# Patient Record
Sex: Female | Born: 1970 | Hispanic: Yes | Marital: Married | Smoking: Never smoker
Health system: Southern US, Community
[De-identification: ages and names within clinical notes are randomized; demographics above are authoritative.]

## PROBLEM LIST (undated history)

## (undated) ENCOUNTER — Ambulatory Visit: Discharge status: Absent without leave | Payer: 59

## (undated) DIAGNOSIS — N200 Calculus of kidney: Secondary | ICD-10-CM

## (undated) HISTORY — PX: COLONOSCOPY: SHX174

---

## 2017-11-16 DIAGNOSIS — Z0000 Encounter for general adult medical examination without abnormal findings: Secondary | ICD-10-CM | POA: Diagnosis not present

## 2017-11-16 DIAGNOSIS — Z13220 Encounter for screening for lipoid disorders: Secondary | ICD-10-CM | POA: Diagnosis not present

## 2018-05-12 DIAGNOSIS — M79601 Pain in right arm: Secondary | ICD-10-CM | POA: Diagnosis not present

## 2018-07-04 DIAGNOSIS — Z87442 Personal history of urinary calculi: Secondary | ICD-10-CM | POA: Diagnosis not present

## 2018-11-18 DIAGNOSIS — Z13220 Encounter for screening for lipoid disorders: Secondary | ICD-10-CM | POA: Diagnosis not present

## 2018-11-18 DIAGNOSIS — Z124 Encounter for screening for malignant neoplasm of cervix: Secondary | ICD-10-CM | POA: Diagnosis not present

## 2018-11-18 DIAGNOSIS — Z0000 Encounter for general adult medical examination without abnormal findings: Secondary | ICD-10-CM | POA: Diagnosis not present

## 2018-11-18 DIAGNOSIS — Z23 Encounter for immunization: Secondary | ICD-10-CM | POA: Diagnosis not present

## 2018-11-29 ENCOUNTER — Other Ambulatory Visit: Payer: Self-pay | Admitting: Physician Assistant

## 2018-11-29 DIAGNOSIS — Z1231 Encounter for screening mammogram for malignant neoplasm of breast: Secondary | ICD-10-CM

## 2018-12-28 ENCOUNTER — Inpatient Hospital Stay: Admission: RE | Admit: 2018-12-28 | Payer: Self-pay | Source: Ambulatory Visit

## 2019-01-30 ENCOUNTER — Ambulatory Visit: Payer: Self-pay

## 2019-03-20 ENCOUNTER — Ambulatory Visit
Admission: RE | Admit: 2019-03-20 | Discharge: 2019-03-20 | Disposition: A | Discharge status: Home / Self Care | Payer: 59 | Source: Ambulatory Visit | Attending: Physician Assistant | Admitting: Physician Assistant

## 2019-03-20 DIAGNOSIS — Z1231 Encounter for screening mammogram for malignant neoplasm of breast: Secondary | ICD-10-CM

## 2021-03-27 IMAGING — MG DIGITAL SCREENING BILATERAL MAMMOGRAM WITH TOMO AND CAD
8 series · 9 of 24 positions shown · non-contrast
Comparison: Previous exam(s).

CLINICAL DATA: Screening.

EXAM:
DIGITAL SCREENING BILATERAL MAMMOGRAM WITH TOMO AND CAD

[L MLO synth-2D]
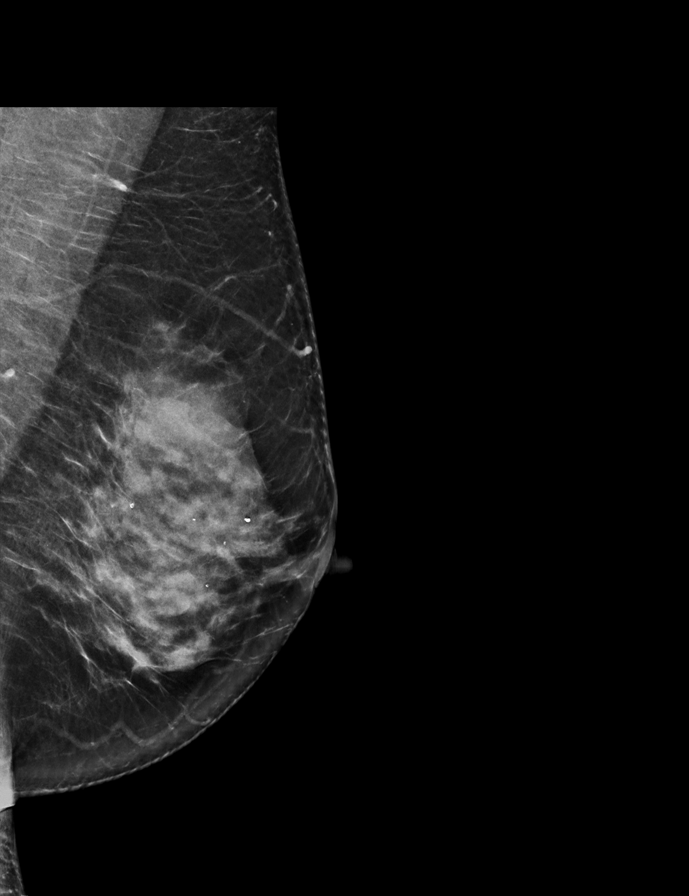

[R MLO synth-2D]
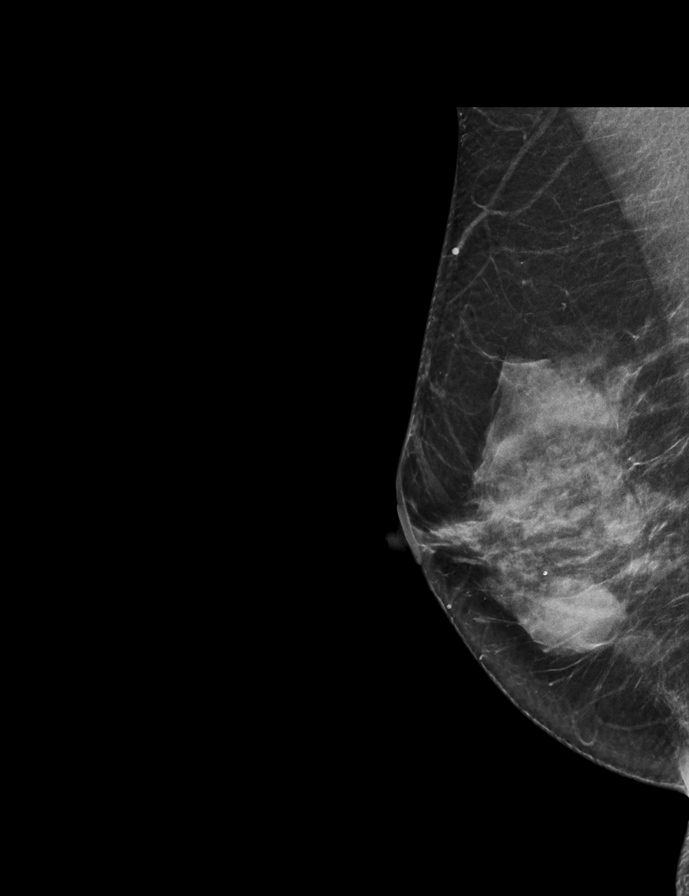

[R CC synth-2D]
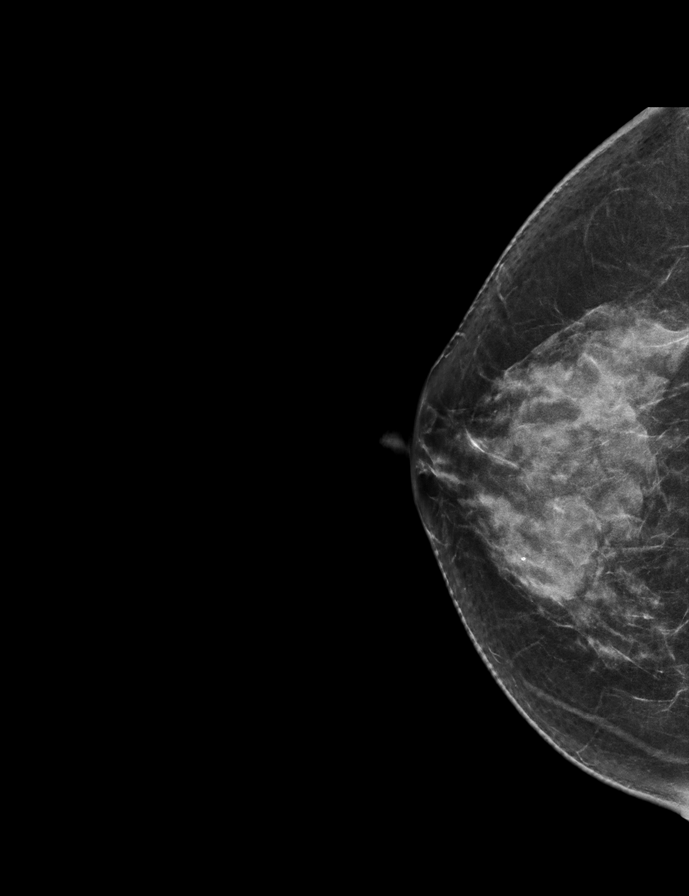

[L CC synth-2D]
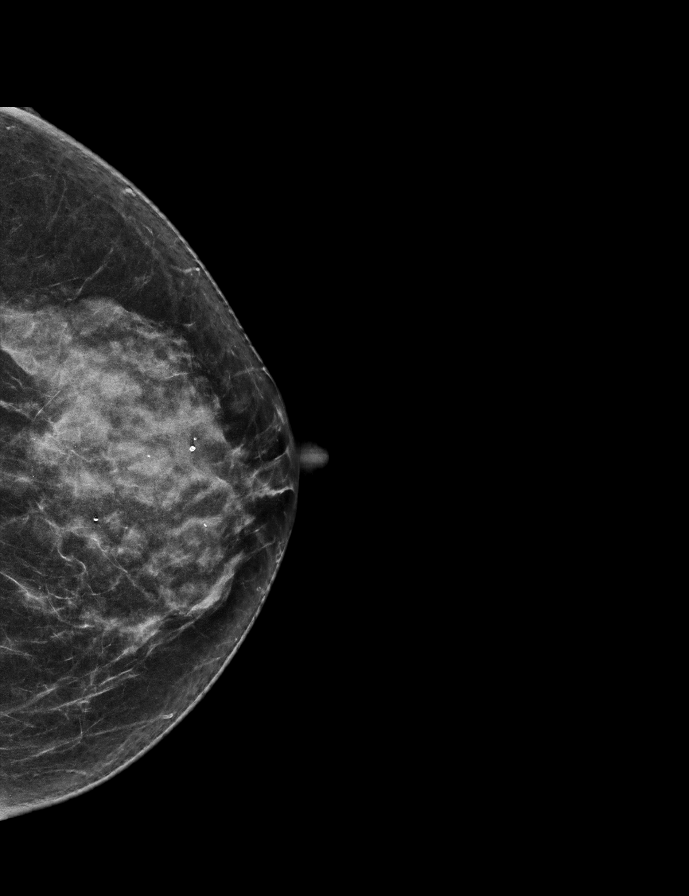

[L CC tomo · 2 of 61 frames shown]
[frame 20/61]
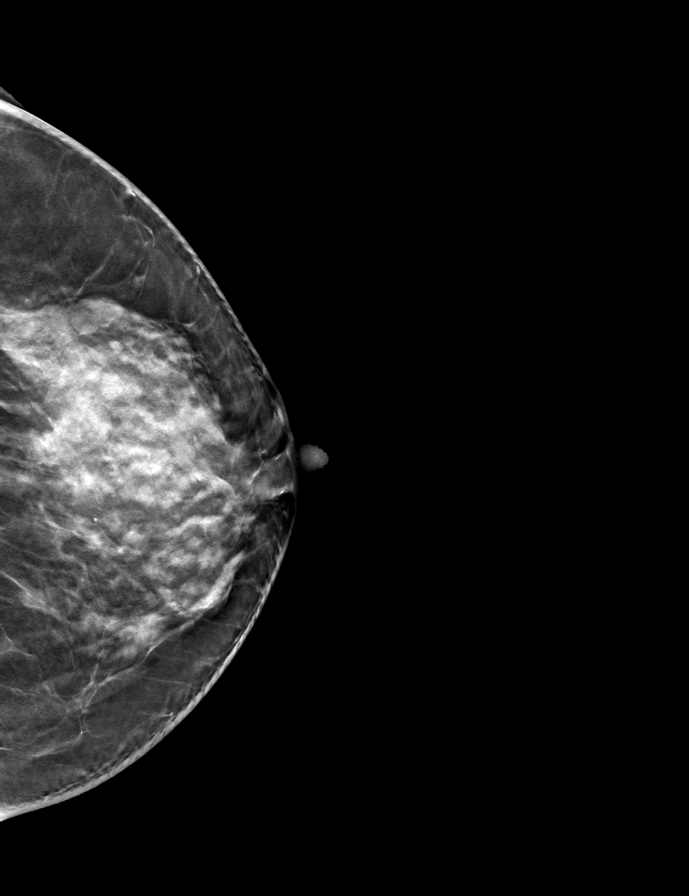
[frame 31/61]
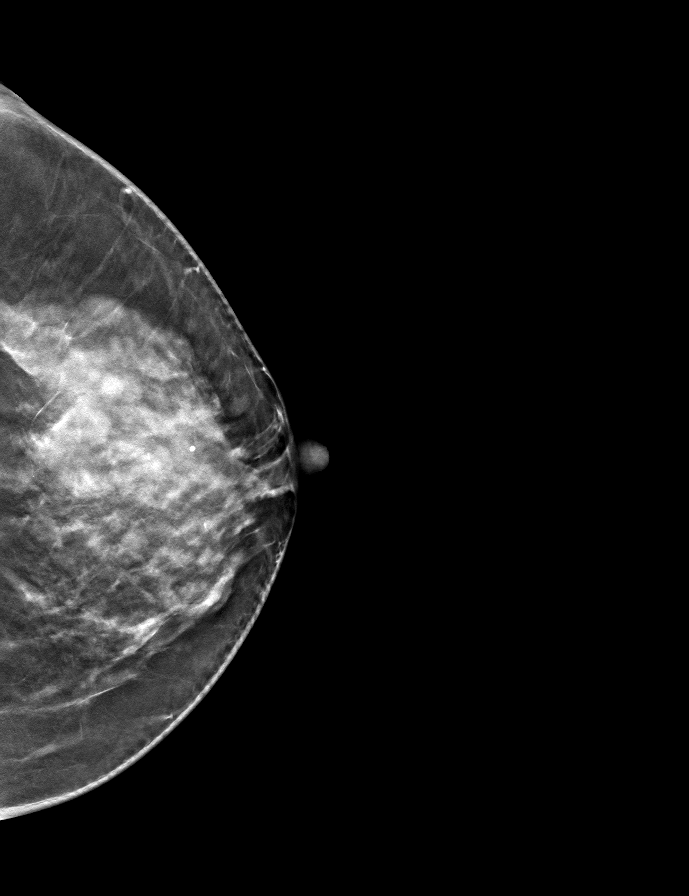

[R CC tomo · tomo slice 33/65.0]
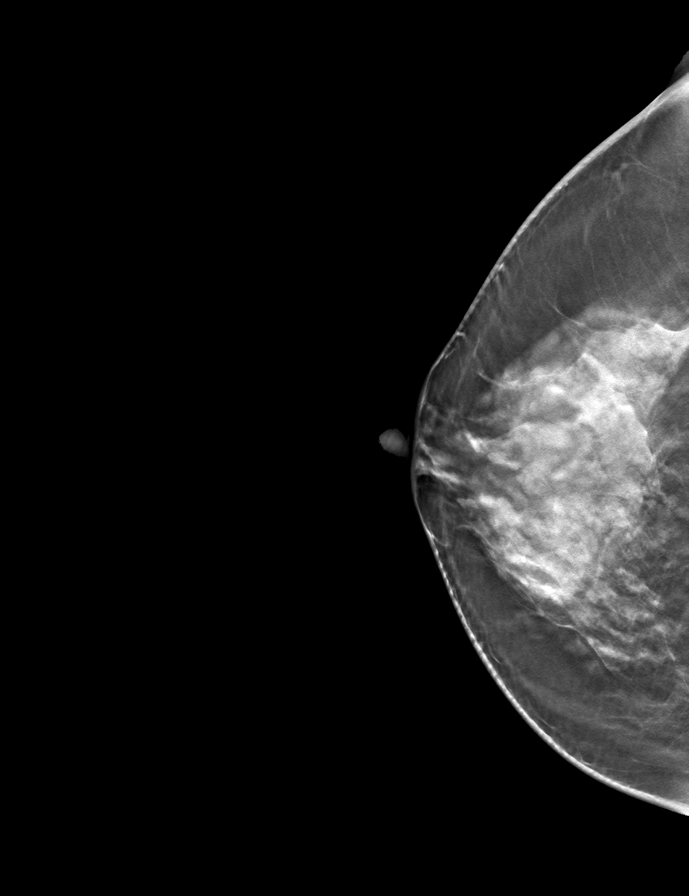

[R MLO tomo · tomo slice 33/66.0]
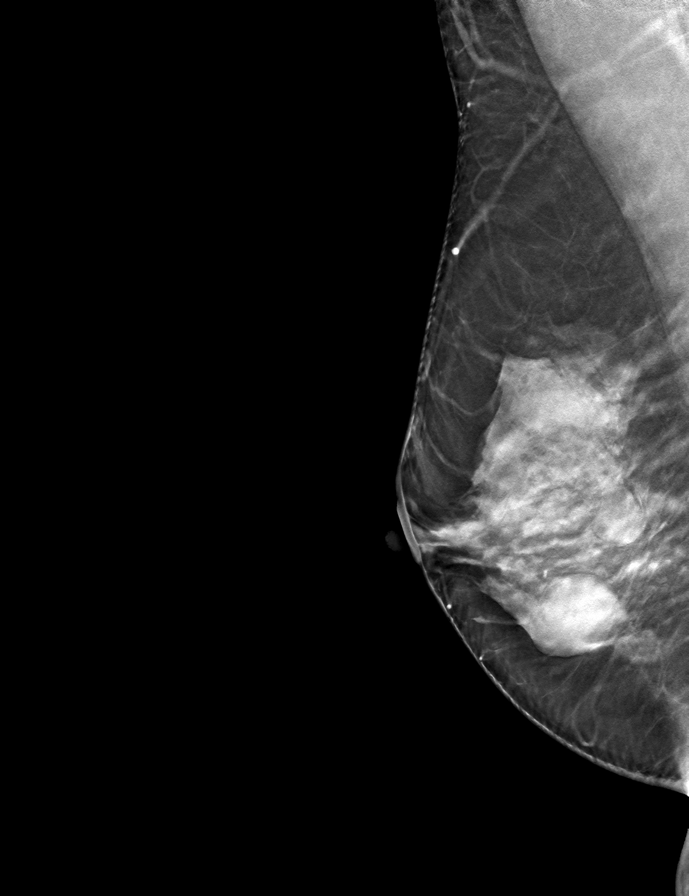

[L MLO tomo · tomo slice 36/71.0]
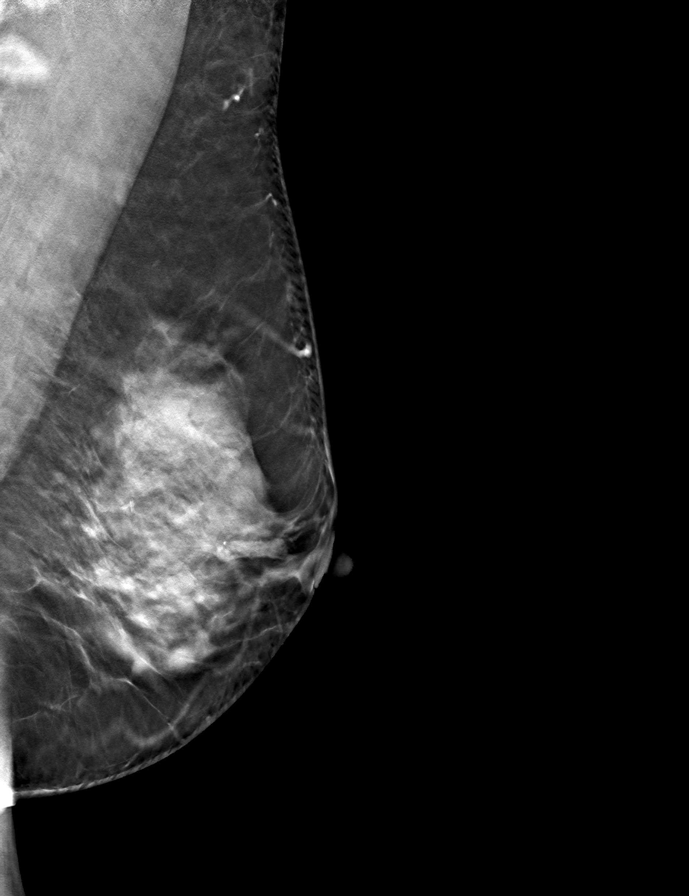

[9 of 24 positions shown; findings below may reference images not displayed]

ACR Breast Density Category c: The breast tissue is heterogeneously
dense, which may obscure small masses.
FINDINGS: There are no findings suspicious for malignancy.

Waxing and waning circumscribed oval masses within both breasts are
considered benign.

Images were processed with CAD.
IMPRESSION: No mammographic evidence of malignancy. A result letter of this
screening mammogram will be mailed directly to the patient.

RECOMMENDATION:
Screening mammogram in one year. (Code:FX-I-SHX)

BI-RADS CATEGORY  2: Benign.

## 2021-10-09 ENCOUNTER — Emergency Department (HOSPITAL_BASED_OUTPATIENT_CLINIC_OR_DEPARTMENT_OTHER): Payer: 59

## 2021-10-09 ENCOUNTER — Emergency Department (HOSPITAL_BASED_OUTPATIENT_CLINIC_OR_DEPARTMENT_OTHER)
Admission: EM | Admit: 2021-10-09 | Discharge: 2021-10-09 | Disposition: A | Discharge status: Home / Self Care | Payer: 59 | Attending: Emergency Medicine | Admitting: Emergency Medicine

## 2021-10-09 ENCOUNTER — Other Ambulatory Visit: Payer: Self-pay

## 2021-10-09 ENCOUNTER — Encounter (HOSPITAL_BASED_OUTPATIENT_CLINIC_OR_DEPARTMENT_OTHER): Payer: Self-pay | Admitting: Emergency Medicine

## 2021-10-09 DIAGNOSIS — N201 Calculus of ureter: Secondary | ICD-10-CM | POA: Diagnosis not present

## 2021-10-09 DIAGNOSIS — R1031 Right lower quadrant pain: Secondary | ICD-10-CM | POA: Diagnosis present

## 2021-10-09 HISTORY — DX: Calculus of kidney: N20.0

## 2021-10-09 LAB — URINALYSIS, ROUTINE W REFLEX MICROSCOPIC
Bilirubin Urine: NEGATIVE
Glucose, UA: NEGATIVE mg/dL
Ketones, ur: NEGATIVE mg/dL
Leukocytes,Ua: NEGATIVE
Nitrite: NEGATIVE
Protein, ur: NEGATIVE mg/dL
Specific Gravity, Urine: 1.02 (ref 1.005–1.030)
pH: 7.5 (ref 5.0–8.0)

## 2021-10-09 LAB — PREGNANCY, URINE: Preg Test, Ur: NEGATIVE

## 2021-10-09 LAB — URINALYSIS, MICROSCOPIC (REFLEX)

## 2021-10-09 MED ORDER — HYDROMORPHONE HCL 1 MG/ML IJ SOLN
1.0000 mg | Freq: Once | INTRAMUSCULAR | Status: AC
  Administered 2021-10-09: 1 mg via INTRAVENOUS
  Filled 2021-10-09: qty 1

## 2021-10-09 MED ORDER — ONDANSETRON HCL 4 MG/2ML IJ SOLN
4.0000 mg | Freq: Once | INTRAMUSCULAR | Status: AC
  Administered 2021-10-09: 4 mg via INTRAVENOUS
  Filled 2021-10-09: qty 2

## 2021-10-09 MED ORDER — TAMSULOSIN HCL 0.4 MG PO CAPS: ORAL_CAPSULE | ORAL | 0 refills | Status: AC

## 2021-10-09 MED ORDER — HYDROMORPHONE HCL 2 MG PO TABS: 2.0000 mg | ORAL_TABLET | ORAL | 0 refills | Status: AC | PRN

## 2021-10-09 MED ORDER — TAMSULOSIN HCL 0.4 MG PO CAPS
0.4000 mg | ORAL_CAPSULE | Freq: Once | ORAL | Status: AC
  Administered 2021-10-09: 0.4 mg via ORAL
  Filled 2021-10-09: qty 1

## 2021-10-09 MED ORDER — ONDANSETRON 8 MG PO TBDP: 8.0000 mg | ORAL_TABLET | Freq: Three times a day (TID) | ORAL | 1 refills | Status: AC | PRN

## 2021-10-09 NOTE — ED Provider Notes (Signed)
[place] [facility] Provider Note: Georgena Spurling, MD, FACEP  CSN: [id number] MRN: [id number] ARRIVAL: 10/09/21 at [place]: [place]  Abdominal Pain   HISTORY OF PRESENT ILLNESS  10/09/21 3:18 AM Jocelyn Kelley is a 51 y.o. female with a history of kidney stones.  She is here with right lower quadrant abdominal pain that began about an hour ago.  She rates the pain as a 9 out of 10 and likens it to previous kidney stones.  It is not worse with movement or palpation.  She has had some nausea but no vomiting.  She has not noticed hematuria.  She had a colonoscopy yesterday afternoon which she states went well.   Past Medical History:  Diagnosis Date   Kidney stones     Past Surgical History:  Procedure Laterality Date   COLONOSCOPY      History reviewed. No pertinent family history.  Social History   Tobacco Use   Smoking status: Never   Smokeless tobacco: Never  Substance Use Topics   Alcohol use: Never   Drug use: Never    Prior to Admission medications   Medication Sig Start Date End Date Taking? Authorizing Provider  HYDROmorphone (DILAUDID) 2 MG tablet Take 1 tablet (2 mg total) by mouth every 4 (four) hours as needed for severe pain. 10/09/21  Yes Oron Westrup, MD  ondansetron (ZOFRAN-ODT) 8 MG disintegrating tablet Take 1 tablet (8 mg total) by mouth every 8 (eight) hours as needed for nausea or vomiting. 10/09/21  Yes Delma Villalva, MD  tamsulosin (FLOMAX) 0.4 MG CAPS capsule Take 1 capsule daily until stone passes. 10/09/21  Yes Catalaya Garr, MD    Allergies Aspirin and Ibuprofen   REVIEW OF SYSTEMS  Negative except as noted here or in the History of Present Illness.   PHYSICAL EXAMINATION  Initial Vital Signs Blood pressure (!) 158/77, pulse 65, temperature 98.3 F (36.8 C), temperature source Oral, resp. rate (!) 22, height 5\' 2"  (1.575 m), weight 67.1 kg, SpO2 100 %.  Examination General: Well-developed, well-nourished  female in no acute distress; appearance consistent with age of record HENT: normocephalic; atraumatic Eyes: Normal appearance Neck: supple Heart: regular rate and rhythm Lungs: clear to auscultation bilaterally Abdomen: soft; nondistended; nontender; bowel sounds present GU: No CVA tenderness Extremities: No deformity; full range of motion; pulses normal Neurologic: Awake, alert and oriented; motor function intact in all extremities and symmetric; no facial droop Skin: Warm and dry Psychiatric: Appears anxious   RESULTS  Summary of this visit's results, reviewed and interpreted by myself:   EKG Interpretation  Date/Time:    Ventricular Rate:    PR Interval:    QRS Duration:   QT Interval:    QTC Calculation:   R Axis:     Text Interpretation:         Laboratory Studies: Results for orders placed or performed during the hospital encounter of 10/09/21 (from the past 24 hour(s))  Urinalysis, Routine w reflex microscopic Urine, Clean Catch     Status: Abnormal   Collection Time: 10/09/21  3:26 AM  Result Value Ref Range   Color, Urine YELLOW YELLOW   APPearance CLOUDY (A) CLEAR   Specific Gravity, Urine 1.020 1.005 - 1.030   pH 7.5 5.0 - 8.0   Glucose, UA NEGATIVE NEGATIVE mg/dL   Hgb urine dipstick MODERATE (A) NEGATIVE   Bilirubin Urine NEGATIVE NEGATIVE   Ketones, ur NEGATIVE NEGATIVE mg/dL   Protein, ur NEGATIVE NEGATIVE mg/dL  Nitrite NEGATIVE NEGATIVE   Leukocytes,Ua NEGATIVE NEGATIVE  Pregnancy, urine     Status: None   Collection Time: 10/09/21  3:26 AM  Result Value Ref Range   Preg Test, Ur NEGATIVE NEGATIVE  Urinalysis, Microscopic (reflex)     Status: Abnormal   Collection Time: 10/09/21  3:26 AM  Result Value Ref Range   RBC / HPF 21-50 0 - 5 RBC/hpf   WBC, UA 0-5 0 - 5 WBC/hpf   Bacteria, UA MANY (A) NONE SEEN   Squamous Epithelial / LPF 0-5 0 - 5   Amorphous Crystal PRESENT    Imaging Studies: CT Renal Stone Study  Result Date:  10/09/2021 CLINICAL DATA:  Flank pain.  Concern for kidney stone. EXAM: CT ABDOMEN AND PELVIS WITHOUT CONTRAST TECHNIQUE: Multidetector CT imaging of the abdomen and pelvis was performed following the standard protocol without IV contrast. RADIATION DOSE REDUCTION: This exam was performed according to the departmental dose-optimization program which includes automated exposure control, adjustment of the mA and/or kV according to patient size and/or use of iterative reconstruction technique. COMPARISON:  None. FINDINGS: Evaluation of this exam is limited in the absence of intravenous contrast. Lower chest: The visualized lung bases clear. No intra-abdominal free air or free fluid. Hepatobiliary: Fatty liver. No intrahepatic biliary ductal dilatation. The gallbladder is unremarkable. Pancreas: Unremarkable. No pancreatic ductal dilatation or surrounding inflammatory changes. Spleen: Normal in size without focal abnormality. Adrenals/Urinary Tract: The adrenal glands are unremarkable. There is a 5 mm stone at the right ureterovesical junction with mild right hydronephrosis. Several additional nonobstructing right renal calculi measure up to 4 mm in the inferior pole of the right kidney. The left kidney is unremarkable. The left ureter and urinary bladder appear unremarkable Stomach/Bowel: There is no bowel obstruction or active inflammation. The appendix is normal. Vascular/Lymphatic: The abdominal aorta and IVC are grossly unremarkable on this noncontrast CT. No portal venous gas. There is no adenopathy. Reproductive: The uterus is anteverted and grossly unremarkable. No adnexal masses. Other: None Musculoskeletal: No acute or significant osseous findings. IMPRESSION: 1. A 5 mm stone at the right ureterovesical junction with mild right hydronephrosis. 2. Several additional nonobstructing right renal calculi. 3. Fatty liver. 4. No bowel obstruction. Normal appendix. Electronically Signed   By: Anner Crete M.D.    On: 10/09/2021 03:47    ED COURSE and MDM  Nursing notes, initial and subsequent vitals signs, including pulse oximetry, reviewed and interpreted by myself.  Vitals:   10/09/21 0312 10/09/21 0316  BP:  (!) 158/77  Pulse:  65  Resp:  (!) 22  Temp:  98.3 F (36.8 C)  TempSrc:  Oral  SpO2:  100%  Weight: 67.1 kg   Height: 5\' 2"  (1.575 m)    Medications  tamsulosin (FLOMAX) capsule 0.4 mg (has no administration in time range)  ondansetron (ZOFRAN) injection 4 mg (4 mg Intravenous Given 10/09/21 0338)  HYDROmorphone (DILAUDID) injection 1 mg (1 mg Intravenous Given 10/09/21 0342)   4:00 AM Pain now 3 out of 10 after IV Dilaudid.  CT scan shows a 5 mm distal ureteral stone on the right.  We will place her on analgesics, antiemetics and Flomax and refer to urology if stone does not pass on its own.   PROCEDURES  Procedures   ED DIAGNOSES     ICD-10-CM   1. Ureterolithiasis  N20.1          Rickelle Sylvestre, MD 10/09/21 [phone]

## 2021-10-09 NOTE — ED Triage Notes (Signed)
Pt abdominal pain feels like kidney stone has Hx of same. Pt had colonoscopy this afternoon.

## 2022-02-09 ENCOUNTER — Other Ambulatory Visit: Payer: Self-pay | Admitting: Family Medicine

## 2022-02-09 DIAGNOSIS — Z1231 Encounter for screening mammogram for malignant neoplasm of breast: Secondary | ICD-10-CM

## 2022-02-10 ENCOUNTER — Ambulatory Visit
Admission: RE | Admit: 2022-02-10 | Discharge: 2022-02-10 | Disposition: A | Discharge status: Home / Self Care | Payer: 59 | Source: Ambulatory Visit | Attending: Family Medicine | Admitting: Family Medicine

## 2022-02-10 DIAGNOSIS — Z1231 Encounter for screening mammogram for malignant neoplasm of breast: Secondary | ICD-10-CM

## 2023-02-04 ENCOUNTER — Other Ambulatory Visit: Payer: Self-pay | Admitting: Family Medicine

## 2023-02-04 DIAGNOSIS — Z1231 Encounter for screening mammogram for malignant neoplasm of breast: Secondary | ICD-10-CM

## 2023-02-16 ENCOUNTER — Ambulatory Visit
Admission: RE | Admit: 2023-02-16 | Discharge: 2023-02-16 | Disposition: A | Discharge status: Home / Self Care | Payer: 59 | Source: Ambulatory Visit | Attending: Family Medicine | Admitting: Family Medicine

## 2023-02-16 DIAGNOSIS — Z1231 Encounter for screening mammogram for malignant neoplasm of breast: Secondary | ICD-10-CM

## 2024-02-11 ENCOUNTER — Other Ambulatory Visit: Payer: Self-pay | Admitting: Family Medicine

## 2024-02-11 DIAGNOSIS — Z1231 Encounter for screening mammogram for malignant neoplasm of breast: Secondary | ICD-10-CM

## 2024-02-29 ENCOUNTER — Ambulatory Visit
Admission: RE | Admit: 2024-02-29 | Discharge: 2024-02-29 | Disposition: A | Discharge status: Home / Self Care | Source: Ambulatory Visit | Attending: Family Medicine | Admitting: Family Medicine

## 2024-02-29 DIAGNOSIS — Z1231 Encounter for screening mammogram for malignant neoplasm of breast: Secondary | ICD-10-CM

## 2024-06-14 NOTE — Progress Notes (Signed)
    Chief Complaint: Kidney stones  History of Present Illness:  Jocelyn Kelley is a 53 y.o. female here for evaluation of nephrolithiasis.  Sent by [facility] at [facility].  She was last seen by urologist about 20 years ago.  That was in [place] .  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  She apparently just passed a stone last month.  She was in [place] at the time.  She said that she had an x-ray done.  She states that initially she had 1 stone treated-she cannot remember if it was done with ureteroscopy or shockwave lithotripsy.  She apparently passes a stone every year or 2.  No associated gross hematuria or febrile episodes with stones.  She states that she has never had a 24-hour collection done.     Past Medical History:  Past Medical History:  Diagnosis Date   Kidney stones     Past Surgical History:  Past Surgical History:  Procedure Laterality Date   COLONOSCOPY      Allergies:  Allergies  Allergen Reactions   Aspirin Other (See Comments)   Ibuprofen     Other reaction(s): Swelling of the eyes    Family History:  No family history on file.  Social History:  Social History   Tobacco Use   Smoking status: Never   Smokeless tobacco: Never  Substance Use Topics   Alcohol use: Never   Drug use: Never    Review of symptoms:  Constitutional:  Negative for unexplained weight loss, night sweats, fever, chills ENT:  Negative for nose bleeds, sinus pain, painful swallowing CV:  Negative for chest pain, shortness of breath, exercise intolerance, palpitations, loss of consciousness Resp:  Negative for cough, wheezing, shortness of breath GI:  Negative for nausea, vomiting, diarrhea, bloody stools GU:  Positives noted in HPI; otherwise negative for gross hematuria, dysuria, urinary incontinence Neuro:  Negative for seizures, poor balance, limb weakness, slurred speech Psych:  Negative for lack of energy, depression, anxiety Endocrine:   Negative for polydipsia, polyuria, symptoms of hypoglycemia (dizziness, hunger, sweating) Hematologic:  Negative for anemia, purpura, petechia, prolonged or excessive bleeding, use of anticoagulants  Allergic:  Negative for difficulty breathing or choking as a result of exposure to anything; no shellfish allergy; no allergic response (rash/itch) to materials, foods  Physical exam: There were no vitals taken for this visit. GENERAL APPEARANCE:  Well appearing, well developed, well nourished, NAD HEENT: Atraumatic, Normocephalic. NECK: Normal appearance LUNGS: Normal inspiratory and expiratory excursion HEART: Regular Rate EXTREMITIES: Moves all extremities well.  Without clubbing, cyanosis, or edema. NEUROLOGIC:  Alert and oriented x 3, normal gait, CN II-XII grossly intact.  MENTAL STATUS:  Appropriate. SKIN:  Warm, dry and intact.    Results:  I have reviewed prior patient's records  I have reviewed referring/prior physicians records  I have reviewed urinalysis  I reviewed prior imaging studies--CT images from 2023 reviewed.  2 calculi in right kidney, none left.  Assessment: Recurrent urolithiasis-last study available was from 2 years ago revealing several right renal calculi.   Plan: -I have recommended 24-hour urine testing-she was given an order form for this.  I will send results by way of MyChart  -KUB today  -Follow-up dependent on above testing  Add: KUB showed probable calcific density 4-5 mm in Rt interpolar region

## 2024-06-16 ENCOUNTER — Telehealth: Payer: Self-pay | Admitting: Urology

## 2024-06-16 NOTE — Telephone Encounter (Signed)
 called and lvm to confirm apt late pt know to be here 15 min early and to bring photo id and ins card ANN 06/16/24

## 2024-06-19 ENCOUNTER — Encounter: Payer: Self-pay | Admitting: Urology

## 2024-06-19 ENCOUNTER — Ambulatory Visit: Admitting: Urology

## 2024-06-19 ENCOUNTER — Ambulatory Visit (HOSPITAL_BASED_OUTPATIENT_CLINIC_OR_DEPARTMENT_OTHER)
Admission: RE | Admit: 2024-06-19 | Discharge: 2024-06-19 | Disposition: A | Discharge status: Home / Self Care | Source: Ambulatory Visit | Attending: Urology | Admitting: Urology

## 2024-06-19 VITALS — BP 133/84 | HR 75 | Ht 62.0 in | Wt 160.0 lb

## 2024-06-19 DIAGNOSIS — N200 Calculus of kidney: Secondary | ICD-10-CM | POA: Insufficient documentation

## 2024-06-19 LAB — URINALYSIS, ROUTINE W REFLEX MICROSCOPIC
Bilirubin, UA: NEGATIVE
Glucose, UA: NEGATIVE
Ketones, UA: NEGATIVE
Leukocytes,UA: NEGATIVE
Nitrite, UA: NEGATIVE
Protein,UA: NEGATIVE
RBC, UA: NEGATIVE
Specific Gravity, UA: 1.02 (ref 1.005–1.030)
Urobilinogen, Ur: 0.2 mg/dL (ref 0.2–1.0)
pH, UA: 5.5 (ref 5.0–7.5)

## 2024-06-20 ENCOUNTER — Telehealth: Payer: Self-pay

## 2024-06-20 NOTE — Telephone Encounter (Signed)
 Matilda Senior, MD  Koleen Cree C, LPN Let pt know that xr showed probable stone in Rt kidney that is probably the same size as when CT was done  2 years ago   [facility] requesting a return call

## 2024-06-20 NOTE — Telephone Encounter (Signed)
 Patient returned our call and I read her the message. She will call back tomorrow and speak with [facility].

## 2024-07-14 ENCOUNTER — Encounter: Payer: Self-pay | Admitting: Urology
